# Patient Record
Sex: Male | Born: 2000 | Race: White | Hispanic: No | Marital: Single | Smoking: Never smoker
Health system: Southern US, Community
[De-identification: ages and names within clinical notes are randomized; demographics above are authoritative.]

## PROBLEM LIST (undated history)

## (undated) HISTORY — PX: OTHER SURGICAL HISTORY: SHX169

---
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
## 2018-12-14 DIAGNOSIS — Z20828 Contact with and (suspected) exposure to other viral communicable diseases: Secondary | ICD-10-CM | POA: Diagnosis not present

## 2018-12-14 DIAGNOSIS — U071 COVID-19: Secondary | ICD-10-CM | POA: Diagnosis not present

## 2019-05-15 ENCOUNTER — Ambulatory Visit: Payer: 59 | Attending: Internal Medicine

## 2019-05-15 DIAGNOSIS — Z20822 Contact with and (suspected) exposure to covid-19: Secondary | ICD-10-CM

## 2019-05-16 LAB — NOVEL CORONAVIRUS, NAA: SARS-CoV-2, NAA: NOT DETECTED

## 2019-06-05 DIAGNOSIS — J029 Acute pharyngitis, unspecified: Secondary | ICD-10-CM | POA: Diagnosis not present

## 2020-09-09 ENCOUNTER — Other Ambulatory Visit: Payer: Self-pay

## 2020-09-09 MED ORDER — CIPROFLOXACIN HCL 500 MG PO TABS
ORAL_TABLET | ORAL | 0 refills | Status: AC
  Filled 2020-09-09: qty 30, 15d supply, fill #0

## 2020-09-09 MED ORDER — METHYLPREDNISOLONE 4 MG PO TBPK
ORAL_TABLET | ORAL | 0 refills | Status: AC
  Filled 2020-09-09: qty 21, 6d supply, fill #0

## 2020-09-09 MED ORDER — ONDANSETRON 8 MG PO TBDP
ORAL_TABLET | ORAL | 0 refills | Status: AC
  Filled 2020-09-09: qty 30, 10d supply, fill #0

## 2020-09-09 MED ORDER — FLUTICASONE PROPIONATE 50 MCG/ACT NA SUSP
NASAL | 0 refills | Status: AC
  Filled 2020-09-09: qty 16, 30d supply, fill #0

## 2020-09-13 ENCOUNTER — Other Ambulatory Visit: Payer: Self-pay

## 2020-09-13 MED ORDER — CARESTART COVID-19 HOME TEST VI KIT
PACK | 0 refills | Status: AC
  Filled 2020-09-13: qty 2, 4d supply, fill #0

## 2020-09-16 ENCOUNTER — Telehealth: Payer: Self-pay | Admitting: Emergency Medicine

## 2020-09-16 DIAGNOSIS — S63501A Unspecified sprain of right wrist, initial encounter: Secondary | ICD-10-CM | POA: Diagnosis not present

## 2020-09-16 DIAGNOSIS — H5213 Myopia, bilateral: Secondary | ICD-10-CM | POA: Diagnosis not present

## 2020-09-16 MED ORDER — NIRMATRELVIR/RITONAVIR (PAXLOVID)TABLET
3.0000 | ORAL_TABLET | Freq: Two times a day (BID) | ORAL | 0 refills | Status: AC
  Filled 2020-09-16: qty 30, 5d supply, fill #0

## 2020-09-16 NOTE — Telephone Encounter (Signed)
Electronic Rx sent.

## 2020-09-17 ENCOUNTER — Other Ambulatory Visit: Payer: Self-pay

## 2020-09-24 ENCOUNTER — Other Ambulatory Visit: Payer: Self-pay

## 2020-09-29 ENCOUNTER — Other Ambulatory Visit: Payer: Self-pay

## 2022-10-06 DIAGNOSIS — Z0000 Encounter for general adult medical examination without abnormal findings: Secondary | ICD-10-CM | POA: Diagnosis not present
